# Patient Record
Sex: Male | Born: 1990 | Race: Black or African American | Hispanic: No | Marital: Single | Smoking: Current every day smoker
Health system: Southern US, Community
[De-identification: ages and names within clinical notes are randomized; demographics above are authoritative.]

---
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
## 2013-03-11 ENCOUNTER — Telehealth: Payer: Self-pay

## 2013-03-11 NOTE — Telephone Encounter (Signed)
Whoever ordered MRI needs to prescribe it.

## 2013-03-11 NOTE — Telephone Encounter (Signed)
Previous telephone message was taken on this pt in error.

## 2013-03-11 NOTE — Telephone Encounter (Signed)
Pt states he went to have his MRI brain done at [facility]. States he found out he's claustrophobic during that process and [facility] requires rx for a sedative to proceed with MRI.   Please call pt:  [phone]

## 2017-04-19 ENCOUNTER — Encounter (HOSPITAL_COMMUNITY): Payer: Self-pay

## 2017-04-19 ENCOUNTER — Other Ambulatory Visit: Payer: Self-pay

## 2017-04-19 DIAGNOSIS — R1084 Generalized abdominal pain: Secondary | ICD-10-CM | POA: Diagnosis not present

## 2017-04-19 DIAGNOSIS — R110 Nausea: Secondary | ICD-10-CM | POA: Diagnosis present

## 2017-04-19 DIAGNOSIS — R197 Diarrhea, unspecified: Secondary | ICD-10-CM | POA: Insufficient documentation

## 2017-04-19 DIAGNOSIS — F17200 Nicotine dependence, unspecified, uncomplicated: Secondary | ICD-10-CM | POA: Insufficient documentation

## 2017-04-19 LAB — COMPREHENSIVE METABOLIC PANEL
ALT: 18 U/L (ref 17–63)
ANION GAP: 11 (ref 5–15)
AST: 28 U/L (ref 15–41)
Albumin: 4.6 g/dL (ref 3.5–5.0)
Alkaline Phosphatase: 52 U/L (ref 38–126)
BILIRUBIN TOTAL: 0.8 mg/dL (ref 0.3–1.2)
BUN: 9 mg/dL (ref 6–20)
CO2: 25 mmol/L (ref 22–32)
Calcium: 9.7 mg/dL (ref 8.9–10.3)
Chloride: 106 mmol/L (ref 101–111)
Creatinine, Ser: 0.91 mg/dL (ref 0.61–1.24)
GFR calc non Af Amer: >60 mL/min (ref >60)
Glucose, Bld: 81 mg/dL (ref 65–99)
POTASSIUM: 3.9 mmol/L (ref 3.5–5.1)
Sodium: 142 mmol/L (ref 135–145)
TOTAL PROTEIN: 8.3 g/dL — AB (ref 6.5–8.1)

## 2017-04-19 LAB — URINALYSIS, ROUTINE W REFLEX MICROSCOPIC
BACTERIA UA: NONE SEEN
BILIRUBIN URINE: NEGATIVE
GLUCOSE, UA: NEGATIVE mg/dL
Hgb urine dipstick: NEGATIVE
KETONES UR: NEGATIVE mg/dL
LEUKOCYTES UA: NEGATIVE
NITRITE: NEGATIVE
PROTEIN: NEGATIVE mg/dL
RBC / HPF: NONE SEEN RBC/hpf (ref 0–5)
Specific Gravity, Urine: 1.016 (ref 1.005–1.030)
pH: 5 (ref 5.0–8.0)

## 2017-04-19 LAB — CBC
HEMATOCRIT: 45.2 % (ref 39.0–52.0)
HEMOGLOBIN: 14.8 g/dL (ref 13.0–17.0)
MCH: 28.8 pg (ref 26.0–34.0)
MCHC: 32.7 g/dL (ref 30.0–36.0)
MCV: 87.9 fL (ref 78.0–100.0)
Platelets: 285 10*3/uL (ref 150–400)
RBC: 5.14 MIL/uL (ref 4.22–5.81)
RDW: 14.6 % (ref 11.5–15.5)
WBC: 6 10*3/uL (ref 4.0–10.5)

## 2017-04-19 LAB — LIPASE, BLOOD: Lipase: 23 U/L (ref 11–51)

## 2017-04-19 MED ORDER — ONDANSETRON 4 MG PO TBDP
4.0000 mg | ORAL_TABLET | Freq: Once | ORAL | Status: AC | PRN
  Administered 2017-04-19: 4 mg via ORAL
  Filled 2017-04-19: qty 1

## 2017-04-19 NOTE — ED Triage Notes (Signed)
Pt reports 8/10 RLQ pain, nausea, and diarrhea x2 days. Pt denies emesis. Pt A+OX4, NAD.

## 2017-04-20 ENCOUNTER — Emergency Department (HOSPITAL_COMMUNITY)
Admission: EM | Admit: 2017-04-20 | Discharge: 2017-04-20 | Disposition: A | Discharge status: Home / Self Care | Payer: Medicaid Other | Attending: Emergency Medicine | Admitting: Emergency Medicine

## 2017-04-20 DIAGNOSIS — R197 Diarrhea, unspecified: Secondary | ICD-10-CM

## 2017-04-20 DIAGNOSIS — R110 Nausea: Secondary | ICD-10-CM

## 2017-04-20 DIAGNOSIS — R1084 Generalized abdominal pain: Secondary | ICD-10-CM

## 2017-04-20 MED ORDER — ONDANSETRON 4 MG PO TBDP: 4.0000 mg | ORAL_TABLET | Freq: Three times a day (TID) | ORAL | 0 refills | Status: DC | PRN

## 2017-04-20 NOTE — ED Provider Notes (Signed)
[facility]-EMERGENCY DEPT Provider Note   CSN: [id number] Arrival date & time: 04/19/17  1737     History   Chief Complaint Chief Complaint  Patient presents with  . Abdominal Pain    HPI Kent Gilbert is a 27 y.o. male.  Patient presents to the ED with a chief complaint of nausea and diarrhea.  He states that the symptoms started 2 days ago.  He states that his family member was sick with the same recently.  He reports associated crampy abdominal pain.  He has not taken anything for his symptoms.  He denies any fevers or chills.   The history is provided by the patient. No language interpreter was used.    History reviewed. No pertinent past medical history.  There are no active problems to display for this patient.   History reviewed. No pertinent surgical history.      Home Medications    Prior to Admission medications   Not on File    Family History History reviewed. No pertinent family history.  Social History Social History   Tobacco Use  . Smoking status: Current Every Day Smoker  . Smokeless tobacco: Never Used  Substance Use Topics  . Alcohol use: Yes  . Drug use: Not on file     Allergies   Patient has no known allergies.   Review of Systems Review of Systems  All other systems reviewed and are negative.    Physical Exam Updated Vital Signs BP (!) 143/90   Pulse 72   Temp 98.4 F (36.9 C) (Oral)   Resp 17   SpO2 100%   Physical Exam  Constitutional: He is oriented to person, place, and time. He appears well-developed and well-nourished.  HENT:  Head: Normocephalic and atraumatic.  Eyes: Pupils are equal, round, and reactive to light. Conjunctivae and EOM are normal. Right eye exhibits no discharge. Left eye exhibits no discharge. No scleral icterus.  Neck: Normal range of motion. Neck supple. No JVD present.  Cardiovascular: Normal rate, regular rhythm and normal heart sounds. Exam reveals no gallop and no  friction rub.  No murmur heard. Pulmonary/Chest: Effort normal and breath sounds normal. No respiratory distress. He has no wheezes. He has no rales. He exhibits no tenderness.  Abdominal: Soft. He exhibits no distension and no mass. There is generalized tenderness. There is no rebound and no guarding.  Musculoskeletal: Normal range of motion. He exhibits no edema or tenderness.  Neurological: He is alert and oriented to person, place, and time.  Skin: Skin is warm and dry.  Psychiatric: He has a normal mood and affect. His behavior is normal. Judgment and thought content normal.  Nursing note and vitals reviewed.    ED Treatments / Results  Labs (all labs ordered are listed, but only abnormal results are displayed) Labs Reviewed  COMPREHENSIVE METABOLIC PANEL - Abnormal; Notable for the following components:      Result Value   Total Protein 8.3 (*)    All other components within normal limits  URINALYSIS, ROUTINE W REFLEX MICROSCOPIC - Abnormal; Notable for the following components:   Squamous Epithelial / LPF 0-5 (*)    All other components within normal limits  LIPASE, BLOOD  CBC    EKG None  Radiology No results found.  Procedures Procedures (including critical care time)  Medications Ordered in ED Medications  ondansetron (ZOFRAN-ODT) disintegrating tablet 4 mg (4 mg Oral Given 04/19/17 1836)     Initial Impression / Assessment  and Plan / ED Course  I have reviewed the triage vital signs and the nursing notes.  Pertinent labs & imaging results that were available during my care of the patient were reviewed by me and considered in my medical decision making (see chart for details).     Patient with nausea and diarrhea.  Also has generalized abdominal discomfort, but no focal tenderness on exam.  Afebrile.  VSS.  Labs are unremarkable, no leukocytosis and normal electrolytes and kidney function.  Family member was sick with similar symptoms.  Likely viral etiology.   Doubt surgical or acute abdomen, but close return precautions have been given.  Patient understands and agrees with the plan.  Final Clinical Impressions(s) / ED Diagnoses   Final diagnoses:  Diarrhea, unspecified type  Nausea  Generalized abdominal pain    ED Discharge Orders        Ordered    ondansetron (ZOFRAN ODT) 4 MG disintegrating tablet  Every 8 hours PRN     04/20/17 0137       Roxy Horseman, PA-C 04/20/17 2130    Dione Booze, MD 04/20/17 [phone]

## 2017-05-28 ENCOUNTER — Encounter (HOSPITAL_COMMUNITY): Payer: Self-pay | Admitting: Emergency Medicine

## 2017-05-28 ENCOUNTER — Ambulatory Visit (HOSPITAL_COMMUNITY)
Admission: EM | Admit: 2017-05-28 | Discharge: 2017-05-28 | Disposition: A | Discharge status: Home / Self Care | Payer: Medicaid Other | Attending: Internal Medicine | Admitting: Internal Medicine

## 2017-05-28 DIAGNOSIS — A084 Viral intestinal infection, unspecified: Secondary | ICD-10-CM | POA: Diagnosis not present

## 2017-05-28 MED ORDER — ONDANSETRON 4 MG PO TBDP
8.0000 mg | ORAL_TABLET | Freq: Once | ORAL | Status: AC
  Administered 2017-05-28: 8 mg via ORAL

## 2017-05-28 MED ORDER — ONDANSETRON 8 MG PO TBDP: 8.0000 mg | ORAL_TABLET | Freq: Three times a day (TID) | ORAL | 0 refills | Status: AC | PRN

## 2017-05-28 MED ORDER — ONDANSETRON 4 MG PO TBDP
ORAL_TABLET | ORAL | Status: AC
  Filled 2017-05-28: qty 2

## 2017-05-28 NOTE — ED Provider Notes (Signed)
[facility]    CSN: [id number] Arrival date & time: 05/28/17  1706     History   Chief Complaint Chief Complaint  Patient presents with  . Nausea    HPI Kent Gilbert is a 27 y.o. male.   This is a healthy 27 year old male, comes in today for abdominal pain with nausea, vomiting and diarrhea onset yesterday.  Patient reports 4 episodes of diarrhea yesterday and 2 episodes today without any blood in stool.  Patient reports 4 episodes of emesis yesterday and 2 episodes today. He endorses abd pain all over abdomen. He felt like he had a fever. Temp is 99.1 in office today. Overall symptoms is improving. Denies sick contact exposure.      History reviewed. No pertinent past medical history.  There are no active problems to display for this patient.   History reviewed. No pertinent surgical history.     Home Medications    Prior to Admission medications   Medication Sig Start Date End Date Taking? Authorizing Provider  ondansetron (ZOFRAN-ODT) 8 MG disintegrating tablet Take 1 tablet (8 mg total) by mouth every 8 (eight) hours as needed for nausea or vomiting. 05/28/17   Lucia Estelle, NP    Family History History reviewed. No pertinent family history.  Social History Social History   Tobacco Use  . Smoking status: Current Every Day Smoker  . Smokeless tobacco: Never Used  Substance Use Topics  . Alcohol use: Yes  . Drug use: Not on file     Allergies   Patient has no known allergies.   Review of Systems Review of Systems  Constitutional: Positive for fever. Negative for chills and fatigue.  Respiratory: Negative.  Negative for cough and shortness of breath.   Cardiovascular: Negative for chest pain and palpitations.  Gastrointestinal:       As stated in the HPI  Skin: Negative for rash.  Neurological: Negative for dizziness and headaches.     Physical Exam Triage Vital Signs ED Triage Vitals [05/28/17 1759]  Enc Vitals Group     BP  131/85     Pulse Rate 81     Resp 18     Temp 99.1 F (37.3 C)     Temp Source Oral     SpO2 98 %     Weight      Height      Head Circumference      Peak Flow      Pain Score      Pain Loc      Pain Edu?      Excl. in GC?    No data found.  Updated Vital Signs BP 131/85 (BP Location: Left Arm)   Pulse 81   Temp 99.1 F (37.3 C) (Oral)   Resp 18   SpO2 98%   Visual Acuity Right Eye Distance:   Left Eye Distance:   Bilateral Distance:    Right Eye Near:   Left Eye Near:    Bilateral Near:     Physical Exam  Constitutional: He is oriented to person, place, and time. He appears well-developed and well-nourished.  HENT:  Head: Normocephalic and atraumatic.  Neck: Normal range of motion. Neck supple.  Cardiovascular: Normal rate, regular rhythm and normal heart sounds.  Pulmonary/Chest: Effort normal and breath sounds normal. He has no wheezes.  Abdominal: Soft. Bowel sounds are normal. There is tenderness (Generalized tenderness present.).  Neurological: He is alert and oriented to person, place, and time.  Nursing note and vitals reviewed.    UC Treatments / Results  Labs (all labs ordered are listed, but only abnormal results are displayed) Labs Reviewed - No data to display  EKG None  Radiology No results found.  Procedures Procedures (including critical care time)  Medications Ordered in UC Medications  ondansetron (ZOFRAN-ODT) disintegrating tablet 8 mg (has no administration in time range)    Initial Impression / Assessment and Plan / UC Course  I have reviewed the triage vital signs and the nursing notes.  Pertinent labs & imaging results that were available during my care of the patient were reviewed by me and considered in my medical decision making (see chart for details).   Final Clinical Impressions(s) / UC Diagnoses   Final diagnoses:  Viral gastroenteritis   Physical examination unremarkable.  Clinical presentation is most  consistent with viral gastroenteritis.  Prescription for Zofran given to help with nausea.  Recommending brat diet.  Recommending sugar-free Gatorade for hydration.  Patient may use over-the-counter Imodium for diarrhea as needed.  Informed to follow-up with PCP or return if symptoms does not improve. Discharge Instructions   None    ED Prescriptions    Medication Sig Dispense Auth. Provider   ondansetron (ZOFRAN-ODT) 8 MG disintegrating tablet Take 1 tablet (8 mg total) by mouth every 8 (eight) hours as needed for nausea or vomiting. 10 tablet Lucia Estelle, NP     Controlled Substance Prescriptions [place] Controlled Substance Registry consulted? Not Applicable   Lucia Estelle, NP 05/28/17 Paulo Fruit

## 2017-05-28 NOTE — ED Triage Notes (Signed)
Pt sts abd pain with nausea and vomiting x 1

## 2018-04-20 ENCOUNTER — Emergency Department (HOSPITAL_COMMUNITY): Payer: No Typology Code available for payment source

## 2018-04-20 ENCOUNTER — Encounter (HOSPITAL_COMMUNITY): Payer: Self-pay

## 2018-04-20 ENCOUNTER — Emergency Department (HOSPITAL_COMMUNITY)
Admission: EM | Admit: 2018-04-20 | Discharge: 2018-04-20 | Disposition: A | Discharge status: Home / Self Care | Payer: No Typology Code available for payment source | Attending: Emergency Medicine | Admitting: Emergency Medicine

## 2018-04-20 ENCOUNTER — Other Ambulatory Visit: Payer: Self-pay

## 2018-04-20 DIAGNOSIS — Y92410 Unspecified street and highway as the place of occurrence of the external cause: Secondary | ICD-10-CM | POA: Insufficient documentation

## 2018-04-20 DIAGNOSIS — S060X0A Concussion without loss of consciousness, initial encounter: Secondary | ICD-10-CM | POA: Diagnosis not present

## 2018-04-20 DIAGNOSIS — R51 Headache: Secondary | ICD-10-CM | POA: Diagnosis present

## 2018-04-20 DIAGNOSIS — S20219A Contusion of unspecified front wall of thorax, initial encounter: Secondary | ICD-10-CM | POA: Diagnosis not present

## 2018-04-20 DIAGNOSIS — F17210 Nicotine dependence, cigarettes, uncomplicated: Secondary | ICD-10-CM | POA: Diagnosis not present

## 2018-04-20 DIAGNOSIS — S8012XA Contusion of left lower leg, initial encounter: Secondary | ICD-10-CM

## 2018-04-20 DIAGNOSIS — Y939 Activity, unspecified: Secondary | ICD-10-CM | POA: Diagnosis not present

## 2018-04-20 DIAGNOSIS — Y999 Unspecified external cause status: Secondary | ICD-10-CM | POA: Diagnosis not present

## 2018-04-20 MED ORDER — HYDROCODONE-ACETAMINOPHEN 5-325 MG PO TABS
1.0000 | ORAL_TABLET | Freq: Once | ORAL | Status: AC
  Administered 2018-04-20: 1 via ORAL
  Filled 2018-04-20: qty 1

## 2018-04-20 MED ORDER — IBUPROFEN 600 MG PO TABS: 600.0000 mg | ORAL_TABLET | Freq: Four times a day (QID) | ORAL | 0 refills | Status: DC | PRN

## 2018-04-20 MED ORDER — HYDROCODONE-ACETAMINOPHEN 5-325 MG PO TABS: 1.0000 | ORAL_TABLET | ORAL | 0 refills | Status: DC | PRN

## 2018-04-20 MED ORDER — IBUPROFEN 800 MG PO TABS
800.0000 mg | ORAL_TABLET | Freq: Once | ORAL | Status: AC
  Administered 2018-04-20: 800 mg via ORAL
  Filled 2018-04-20: qty 1
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
## 2018-04-20 NOTE — ED Notes (Signed)
RETURNED FROM X-RAY

## 2018-04-20 NOTE — ED Notes (Signed)
PT. CURRENTLY IN X-RAY.  

## 2018-04-20 NOTE — ED Notes (Signed)
ORTHO TECH CALLED TO ASSIST
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
## 2020-04-09 ENCOUNTER — Encounter (HOSPITAL_COMMUNITY): Payer: Self-pay | Admitting: Urgent Care

## 2020-04-09 ENCOUNTER — Other Ambulatory Visit: Payer: Self-pay

## 2020-04-09 ENCOUNTER — Ambulatory Visit (HOSPITAL_COMMUNITY)
Admission: EM | Admit: 2020-04-09 | Discharge: 2020-04-09 | Disposition: A | Discharge status: Home / Self Care | Payer: Medicaid Other | Attending: Urgent Care | Admitting: Urgent Care

## 2020-04-09 DIAGNOSIS — Z20822 Contact with and (suspected) exposure to covid-19: Secondary | ICD-10-CM | POA: Insufficient documentation

## 2020-04-09 DIAGNOSIS — Z7251 High risk heterosexual behavior: Secondary | ICD-10-CM | POA: Insufficient documentation

## 2020-04-09 DIAGNOSIS — F17210 Nicotine dependence, cigarettes, uncomplicated: Secondary | ICD-10-CM | POA: Insufficient documentation

## 2020-04-09 DIAGNOSIS — F17200 Nicotine dependence, unspecified, uncomplicated: Secondary | ICD-10-CM

## 2020-04-09 DIAGNOSIS — R197 Diarrhea, unspecified: Secondary | ICD-10-CM | POA: Insufficient documentation

## 2020-04-09 DIAGNOSIS — R0602 Shortness of breath: Secondary | ICD-10-CM | POA: Insufficient documentation

## 2020-04-09 DIAGNOSIS — R000 Tachycardia, unspecified: Secondary | ICD-10-CM | POA: Insufficient documentation

## 2020-04-09 DIAGNOSIS — B349 Viral infection, unspecified: Secondary | ICD-10-CM | POA: Diagnosis not present

## 2020-04-09 MED ORDER — LOPERAMIDE HCL 2 MG PO CAPS: 2.0000 mg | ORAL_CAPSULE | Freq: Two times a day (BID) | ORAL | 0 refills | Status: AC | PRN

## 2020-04-09 MED ORDER — ONDANSETRON 8 MG PO TBDP
8.0000 mg | ORAL_TABLET | Freq: Three times a day (TID) | ORAL | 0 refills | Status: AC | PRN
Start: 2020-04-09 — End: ?

## 2020-04-09 NOTE — Discharge Instructions (Signed)
We will notify you of your COVID-19 test results as they arrive and may take between 24 to 48 hours.  I encourage you to sign up for MyChart if you have not already done so as this can be the easiest way for [place] to communicate results to you online or through a phone app.  In the meantime, if you develop worsening symptoms including fever, chest pain, shortness of breath despite our current treatment plan then please report to the emergency room as this may be a sign of worsening status from possible COVID-19 infection.  Otherwise, we will manage this as a viral syndrome. For sore throat or cough try using a honey-based tea. Use 3 teaspoons of honey with juice squeezed from half lemon. Place shaved pieces of ginger into 1/2-1 cup of water and warm over stove top. Then mix the ingredients and repeat every 4 hours as needed. Please take Tylenol 500mg -650mg  every 6 hours for aches and pains, fevers. Hydrate very well with at least 2 liters of water. Eat light meals such as soups to replenish electrolytes and soft fruits, veggies.    For diabetes or elevated blood sugar, please make sure you are limiting and avoiding starchy, carbohydrate foods like pasta, breads, sweet breads, pastry, rice, potatoes, desserts. These foods can elevate your blood sugar. Also, limit and avoid drinks that contain a lot of sugar such as sodas, sweet teas, fruit juices.  Drinking plain water will be much more helpful, try 64 ounces of water daily.  It is okay to flavor your water naturally by cutting cucumber, lemon, mint or lime, placing it in a picture with water and drinking it over a period of 24-48 hours as long as it remains refrigerated.  For elevated blood pressure, make sure you are monitoring salt in your diet.  Do not eat restaurant foods and limit processed foods at home. I highly recommend you prepare and cook your own foods at home.  Processed foods include things like frozen meals, pre-seasoned meats and dinners, deli  meats, canned foods as these foods contain a high amount of sodium/salt.  Make sure you are paying attention to sodium labels on foods you buy at the grocery store. Buy your spices separately such as garlic powder, onion powder, cumin, cayenne, parsley flakes so that you can avoid seasonings that contain salt. However, salt-free seasonings are available and can be used, an example is Mrs. Dash and includes a lot of different mixtures that do not contain salt.  Lastly, when cooking using oils that are healthier for you is important. This includes olive oil, avocado oil, canola oil. We have discussed a lot of foods to avoid but below is a list of foods that can be very healthy to use in your diet whether it is for diabetes, cholesterol, high blood pressure, or in general healthy eating.  Salads - kale, spinach, cabbage, spring mix, arugula Fruits - avocadoes, berries (blueberries, raspberries, blackberries), apples, oranges, pomegranate, grapefruit, kiwi Vegetables - asparagus, cauliflower, broccoli, green beans, brussel sprouts, bell peppers, beets; stay away from or limit starchy vegetables like potatoes, carrots, peas Other general foods - kidney beans, egg whites, almonds, walnuts, sunflower seeds, pumpkin seeds, fat free yogurt, almond milk, flax seeds, quinoa, oats  Meat - It is better to eat lean meats and limit your red meat including pork to once a week.  Wild caught fish, chicken breast are good options as they tend to be leaner sources of good protein. Still be mindful of the  sodium labels for the meats you buy.  DO NOT EAT ANY FOODS ON THIS LIST THAT YOU ARE ALLERGIC TO. For more specific needs, I highly recommend consulting a dietician or nutritionist but this can definitely be a good starting point.

## 2020-04-09 NOTE — ED Provider Notes (Signed)
[facility]   MRN: [id number] DOB: March 01, 1990  Subjective:   Kent Gilbert is a 30 y.o. male presenting for an evaluation.  Patient states that he was at work and felt feverish, checked his temperature multiple times and is consistently above 101 F.  He has also had 2-3 bouts of diarrhea per day for the past 7 days.  Patient is a smoker and has intermittent shortness of breath, smokes half pack per day.  Denies headache, sinus congestion, sore throat, cough, chest pain, nausea, vomiting, abdominal pain, body aches.  Patient is not COVID vaccinated.  Patient is sexually active, does not use condoms for protection, has 1 sex partner.  Would like to make sure he does not have an STI except for HIV and syphilis.  Denies penile discharge, dysuria, urinary frequency, testicular or penile pain, genital rash.  No current facility-administered medications for this encounter.  Current Outpatient Medications:  .  ibuprofen (ADVIL,MOTRIN) 600 MG tablet, Take 1 tablet (600 mg total) by mouth every 6 (six) hours as needed., Disp: 30 tablet, Rfl: 0 .  ondansetron (ZOFRAN-ODT) 8 MG disintegrating tablet, Take 1 tablet (8 mg total) by mouth every 8 (eight) hours as needed for nausea or vomiting., Disp: 10 tablet, Rfl: 0   No Known Allergies  History reviewed. No pertinent past medical history.   History reviewed. No pertinent surgical history.  History reviewed. No pertinent family history.  Social History   Tobacco Use  . Smoking status: Current Every Day Smoker    Packs/day: 0.25    Types: Cigarettes  . Smokeless tobacco: Never Used  Vaping Use  . Vaping Use: Never used  Substance Use Topics  . Alcohol use: Yes  . Drug use: Never    ROS   Objective:   Vitals: BP (!) 134/95 (BP Location: Right Arm)   Pulse (!) 114   Temp 99.4 F (37.4 C) (Oral)   Resp 18   SpO2 97%   Pulse rechecks ranged from 98 to 116 bpm.  Physical Exam Constitutional:      General: He is  not in acute distress.    Appearance: Normal appearance. He is well-developed. He is not ill-appearing, toxic-appearing or diaphoretic.  HENT:     Head: Normocephalic and atraumatic.     Right Ear: External ear normal.     Left Ear: External ear normal.     Nose: Nose normal.     Mouth/Throat:     Mouth: Mucous membranes are moist.     Pharynx: Oropharynx is clear.  Eyes:     General: No scleral icterus.    Extraocular Movements: Extraocular movements intact.     Pupils: Pupils are equal, round, and reactive to light.  Cardiovascular:     Rate and Rhythm: Normal rate and regular rhythm.     Heart sounds: Normal heart sounds. No murmur heard. No friction rub. No gallop.   Pulmonary:     Effort: Pulmonary effort is normal. No respiratory distress.     Breath sounds: Normal breath sounds. No stridor. No wheezing, rhonchi or rales.  Abdominal:     General: There is no distension.     Palpations: Abdomen is soft. There is no mass.     Tenderness: There is no abdominal tenderness. There is no guarding or rebound.     Comments: Hyperactive bowel sounds.  Skin:    General: Skin is warm and dry.  Neurological:     Mental Status: He is  alert and oriented to person, place, and time.  Psychiatric:        Mood and Affect: Mood normal.        Behavior: Behavior normal.        Thought Content: Thought content normal.     ED ECG REPORT   Date: 04/09/2020  Rate: 102bpm  Rhythm: sinus tachycardia  QRS Axis: normal  Intervals: normal  ST/T Wave abnormalities: normal  Conduction Disutrbances:none  Narrative Interpretation: Sinus tachycardia at 102 bpm without any acute findings.  No previous EKG available for comparison.  Old EKG Reviewed: none available  I have personally reviewed the EKG tracing and agree with the computerized printout as noted.   Assessment and Plan :   PDMP not reviewed this encounter.  1. Viral illness   2. Diarrhea, unspecified type   3. Tachycardia   4.  Shortness of breath   5. Smoker   6. Unprotected sex     COVID-19 testing is pending.  Have a high suspicion for this given his symptoms today.  Recommended supportive care including Tylenol, rest, fluids.  Regarding his diarrhea, counseled on need for significant dietary modifications as he eats fast food almost exclusively.  Provided him with information on healthy diet.  STI testing is pending. Counseled patient on potential for adverse effects with medications prescribed/recommended today, ER and return-to-clinic precautions discussed, patient verbalized understanding.    Wallis Bamberg, [place] 04/09/20 1850

## 2020-04-09 NOTE — ED Triage Notes (Signed)
Pt presents for note to return to work after having a temperature at temp. Check;  Pt also presents for STD testing with no complaints of any symptoms.

## 2020-04-10 LAB — CYTOLOGY, (ORAL, ANAL, URETHRAL) ANCILLARY ONLY
Chlamydia: NEGATIVE
Comment: NEGATIVE
Comment: NEGATIVE
Comment: NORMAL
Neisseria Gonorrhea: NEGATIVE
Trichomonas: NEGATIVE

## 2020-04-10 LAB — SARS CORONAVIRUS 2 (TAT 6-24 HRS): SARS Coronavirus 2: NEGATIVE

## 2020-05-05 ENCOUNTER — Ambulatory Visit (HOSPITAL_COMMUNITY)
Admission: EM | Admit: 2020-05-05 | Discharge: 2020-05-05 | Disposition: A | Discharge status: Home / Self Care | Payer: Medicaid Other | Attending: Physician Assistant | Admitting: Physician Assistant

## 2020-05-05 ENCOUNTER — Encounter (HOSPITAL_COMMUNITY): Payer: Self-pay

## 2020-05-05 ENCOUNTER — Other Ambulatory Visit: Payer: Self-pay

## 2020-05-05 DIAGNOSIS — M542 Cervicalgia: Secondary | ICD-10-CM | POA: Insufficient documentation

## 2020-05-05 DIAGNOSIS — J029 Acute pharyngitis, unspecified: Secondary | ICD-10-CM | POA: Diagnosis not present

## 2020-05-05 DIAGNOSIS — M62838 Other muscle spasm: Secondary | ICD-10-CM | POA: Diagnosis not present

## 2020-05-05 LAB — POCT RAPID STREP A, ED / UC: Streptococcus, Group A Screen (Direct): NEGATIVE

## 2020-05-05 MED ORDER — METHOCARBAMOL 500 MG PO TABS: 500.0000 mg | ORAL_TABLET | Freq: Two times a day (BID) | ORAL | 0 refills | Status: AC

## 2020-05-05 NOTE — ED Provider Notes (Signed)
[facility]    CSN: [id number] Arrival date & time: 05/05/20  1831      History   Chief Complaint Chief Complaint  Patient presents with  . Shoulder Pain  . Back Pain  . Vomiting    HPI Kent Gilbert is a 30 y.o. male.   Patient presents today with several complaints.  He reports a 1 week history of intermittent neck and shoulder pain.  He denies known injury or increase in activity prior to symptom onset.  He does have a physical job requiring him to lift throughout the day.  He has not tried any over-the-counter medications for symptom management.  Reports pain is rated 10 on a 0-10 pain scale, described as aching, worse with certain movements or palpation, no alleviating factors and affect.  Denies previous neck surgery or injury.  He denies history of malignancy.  He denies associated fever, weakness, nausea, vomiting, paresthesias.  In addition, patient reports a 1 day history of sore throat.  He reports associated nausea and vomiting that occurred last night but states the symptoms have resolved.  He denies any cough, nasal congestion, fever, headaches, dizziness, abdominal pain, changes in bowel habits.  Reports son was sick with similar symptoms prior to symptom onset.  He denies any recent antibiotic use.  He denies history of allergies, asthma, COPD.  He has not had flu or COVID-19 vaccinations.  He denies any dysphagia, muffled voice, odynophagia.     History reviewed. No pertinent past medical history.  There are no problems to display for this patient.   History reviewed. No pertinent surgical history.     Home Medications    Prior to Admission medications   Medication Sig Start Date End Date Taking? Authorizing Provider  methocarbamol (ROBAXIN) 500 MG tablet Take 1 tablet (500 mg total) by mouth 2 (two) times daily. 05/05/20  Yes Siani Utke, Denny Peon K, PA-C  loperamide (IMODIUM) 2 MG capsule Take 1 capsule (2 mg total) by mouth 2 (two) times daily as needed  for diarrhea or loose stools. 04/09/20   Wallis Bamberg, PA-C  ondansetron (ZOFRAN-ODT) 8 MG disintegrating tablet Take 1 tablet (8 mg total) by mouth every 8 (eight) hours as needed for nausea or vomiting. 05/28/17   Lucia Estelle, NP  ondansetron (ZOFRAN-ODT) 8 MG disintegrating tablet Take 1 tablet (8 mg total) by mouth every 8 (eight) hours as needed for nausea or vomiting. 04/09/20   Wallis Bamberg, PA-C    Family History History reviewed. No pertinent family history.  Social History Social History   Tobacco Use  . Smoking status: Current Every Day Smoker    Packs/day: 0.25    Types: Cigarettes  . Smokeless tobacco: Never Used  Vaping Use  . Vaping Use: Never used  Substance Use Topics  . Alcohol use: Yes  . Drug use: Never     Allergies   Patient has no known allergies.   Review of Systems Review of Systems  Constitutional: Negative for activity change, appetite change, fatigue and fever.  HENT: Positive for sore throat. Negative for congestion, sinus pressure and sneezing.   Respiratory: Negative for cough and shortness of breath.   Gastrointestinal: Positive for nausea and vomiting. Negative for abdominal pain and diarrhea.  Musculoskeletal: Positive for back pain. Negative for arthralgias and myalgias.  Neurological: Negative for dizziness, light-headedness and headaches.     Physical Exam Triage Vital Signs ED Triage Vitals  Enc Vitals Group     BP 05/05/20 1949 (!) 141/101  Pulse Rate 05/05/20 1949 86     Resp 05/05/20 1949 18     Temp 05/05/20 1949 98 F (36.7 C)     Temp Source 05/05/20 1949 Oral     SpO2 05/05/20 1949 99 %     Weight --      Height --      Head Circumference --      Peak Flow --      Pain Score 05/05/20 1950 10     Pain Loc --      Pain Edu? --      Excl. in GC? --    No data found.  Updated Vital Signs BP (!) 141/101 (BP Location: Right Arm)   Pulse 86   Temp 98 F (36.7 C) (Oral)   Resp 18   SpO2 99%   Visual Acuity Right  Eye Distance:   Left Eye Distance:   Bilateral Distance:    Right Eye Near:   Left Eye Near:    Bilateral Near:     Physical Exam Vitals reviewed.  Constitutional:      General: He is awake.     Appearance: Normal appearance. He is normal weight. He is not ill-appearing.     Comments: Very pleasant male appears stated age in no acute distress  HENT:     Head: Normocephalic and atraumatic.     Right Ear: Tympanic membrane, ear canal and external ear normal. Tympanic membrane is not erythematous or bulging.     Left Ear: Tympanic membrane, ear canal and external ear normal. Tympanic membrane is not erythematous or bulging.     Nose: Nose normal.     Mouth/Throat:     Pharynx: Uvula midline. Posterior oropharyngeal erythema present. No oropharyngeal exudate or uvula swelling.     Comments: Moderate erythema posterior oropharynx Cardiovascular:     Rate and Rhythm: Normal rate and regular rhythm.     Heart sounds: No murmur heard.   Pulmonary:     Effort: Pulmonary effort is normal. No accessory muscle usage or respiratory distress.     Breath sounds: Normal breath sounds. No stridor. No wheezing, rhonchi or rales.     Comments: Clear to auscultation bilaterally Musculoskeletal:     Cervical back: Spasms and tenderness present. No bony tenderness. No pain with movement.     Thoracic back: No tenderness or bony tenderness.     Lumbar back: No tenderness or bony tenderness.     Comments: Back: No pain with percussion of vertebrae.  Normal active range of motion.  Spasm noted along right trapezius.  Tenderness palpation of bilateral paraspinal muscles.  Lymphadenopathy:     Head:     Right side of head: No submental, submandibular or tonsillar adenopathy.     Left side of head: No submental, submandibular or tonsillar adenopathy.     Cervical: No cervical adenopathy.  Neurological:     Mental Status: He is alert.  Psychiatric:        Behavior: Behavior is cooperative.       UC Treatments / Results  Labs (all labs ordered are listed, but only abnormal results are displayed) Labs Reviewed  CULTURE, GROUP A STREP [facility])  POCT RAPID STREP A, ED / UC    EKG   Radiology No results found.  Procedures Procedures (including critical care time)  Medications Ordered in UC Medications - No data to display  Initial Impression / Assessment and Plan / UC Course  I have reviewed the  triage vital signs and the nursing notes.  Pertinent labs & imaging results that were available during my care of the patient were reviewed by me and considered in my medical decision making (see chart for details).     Centor score of 1.  Rapid strep negative in office today.  Throat culture obtained-results pending.  Suspect viral etiology.  Patient was encouraged to continue with supportive care.  Strict return precautions given to which patient expressed understanding.  Suspect muscle strain given etiology.  Patient was prescribed methocarbamol to be used at night with instruction not to drive or drink alcohol with this medication as drowsiness is a common side effect.  He can alternate Tylenol and ibuprofen for additional pain relief.  Encouraged heat, rest, stretch.  If symptoms persist or worsen we will need to consider physical therapy and/or imaging which will need to be arranged through PCP.  Strict return precautions given to which patient expressed understanding.  Final Clinical Impressions(s) / UC Diagnoses   Final diagnoses:  Viral pharyngitis  Sore throat  Cervical pain  Muscle spasms of neck     Discharge Instructions     Your strep was negative in office today.  If your culture is positive we will contact you and start antibiotics.  Drink plenty of fluid and use Tylenol/ibuprofen for pain.  He can also gargle with warm salt water to help manage symptoms.  I have called in methocarbamol to help with your back/neck pain.  Do not drive or drink alcohol this  medication is drowsiness is a common side effect.  If you have any worsening symptoms you need to be reevaluated.    ED Prescriptions    Medication Sig Dispense Auth. Provider   methocarbamol (ROBAXIN) 500 MG tablet Take 1 tablet (500 mg total) by mouth 2 (two) times daily. 20 tablet Diontae Route, Noberto Retort, PA-C     PDMP not reviewed this encounter.   Jeani Hawking, PA-C 05/05/20 2052

## 2020-05-05 NOTE — ED Triage Notes (Signed)
Pt present back and shoulder pain with a sore throat. Pt states that shoulder and back pain started two weeks ago. His throat started last night.

## 2020-05-05 NOTE — Discharge Instructions (Addendum)
Your strep was negative in office today.  If your culture is positive we will contact you and start antibiotics.  Drink plenty of fluid and use Tylenol/ibuprofen for pain.  He can also gargle with warm salt water to help manage symptoms.  I have called in methocarbamol to help with your back/neck pain.  Do not drive or drink alcohol this medication is drowsiness is a common side effect.  If you have any worsening symptoms you need to be reevaluated.

## 2020-05-06 LAB — CULTURE, GROUP A STREP (THRC)

## 2020-05-07 ENCOUNTER — Telehealth (HOSPITAL_COMMUNITY): Payer: Self-pay | Admitting: Emergency Medicine

## 2020-05-07 LAB — CULTURE, GROUP A STREP (THRC)

## 2020-05-07 MED ORDER — PENICILLIN V POTASSIUM 500 MG PO TABS: 500.0000 mg | ORAL_TABLET | Freq: Two times a day (BID) | ORAL | 0 refills | Status: AC

## 2022-07-17 ENCOUNTER — Encounter (HOSPITAL_COMMUNITY): Payer: Self-pay

## 2022-07-17 ENCOUNTER — Emergency Department (HOSPITAL_COMMUNITY): Payer: No Typology Code available for payment source

## 2022-07-17 ENCOUNTER — Other Ambulatory Visit: Payer: Self-pay

## 2022-07-17 ENCOUNTER — Emergency Department (HOSPITAL_COMMUNITY)
Admission: EM | Admit: 2022-07-17 | Discharge: 2022-07-17 | Disposition: A | Discharge status: Home / Self Care | Payer: No Typology Code available for payment source | Attending: Emergency Medicine | Admitting: Emergency Medicine

## 2022-07-17 DIAGNOSIS — Y92410 Unspecified street and highway as the place of occurrence of the external cause: Secondary | ICD-10-CM | POA: Diagnosis not present

## 2022-07-17 DIAGNOSIS — S8012XA Contusion of left lower leg, initial encounter: Secondary | ICD-10-CM | POA: Insufficient documentation

## 2022-07-17 DIAGNOSIS — M79662 Pain in left lower leg: Secondary | ICD-10-CM | POA: Diagnosis present

## 2022-07-17 LAB — CBC WITH DIFFERENTIAL/PLATELET
Abs Immature Granulocytes: 0.01 10*3/uL (ref 0.00–0.07)
Basophils Absolute: 0.1 10*3/uL (ref 0.0–0.1)
Basophils Relative: 1 %
Eosinophils Absolute: 0.1 10*3/uL (ref 0.0–0.5)
Eosinophils Relative: 2 %
HCT: 42.2 % (ref 39.0–52.0)
Hemoglobin: 13.5 g/dL (ref 13.0–17.0)
Immature Granulocytes: 0 %
Lymphocytes Relative: 44 %
Lymphs Abs: 2.8 10*3/uL (ref 0.7–4.0)
MCH: 27.9 pg (ref 26.0–34.0)
MCHC: 32 g/dL (ref 30.0–36.0)
MCV: 87.2 fL (ref 80.0–100.0)
Monocytes Absolute: 0.5 10*3/uL (ref 0.1–1.0)
Monocytes Relative: 9 %
Neutro Abs: 2.8 10*3/uL (ref 1.7–7.7)
Neutrophils Relative %: 44 %
Platelets: 363 10*3/uL (ref 150–400)
RBC: 4.84 MIL/uL (ref 4.22–5.81)
RDW: 14.4 % (ref 11.5–15.5)
WBC: 6.3 10*3/uL (ref 4.0–10.5)
nRBC: 0 % (ref 0.0–0.2)

## 2022-07-17 LAB — BASIC METABOLIC PANEL
Anion gap: 13 (ref 5–15)
BUN: 7 mg/dL (ref 6–20)
CO2: 23 mmol/L (ref 22–32)
Calcium: 9.6 mg/dL (ref 8.9–10.3)
Chloride: 104 mmol/L (ref 98–111)
Creatinine, Ser: 1.04 mg/dL (ref 0.61–1.24)
GFR, Estimated: >60 mL/min (ref >60)
Glucose, Bld: 93 mg/dL (ref 70–99)
Potassium: 4.3 mmol/L (ref 3.5–5.1)
Sodium: 140 mmol/L (ref 135–145)

## 2022-07-17 LAB — TROPONIN I (HIGH SENSITIVITY): Troponin I (High Sensitivity): 4 ng/L (ref <18)

## 2022-07-17 MED ORDER — KETOROLAC TROMETHAMINE 15 MG/ML IJ SOLN
15.0000 mg | Freq: Once | INTRAMUSCULAR | Status: DC
  Filled 2022-07-17: qty 1

## 2022-07-17 MED ORDER — IOHEXOL 350 MG/ML SOLN
50.0000 mL | Freq: Once | INTRAVENOUS | Status: AC | PRN
  Administered 2022-07-17: 50 mL via INTRAVENOUS

## 2022-07-17 MED ORDER — FENTANYL CITRATE PF 50 MCG/ML IJ SOSY
50.0000 ug | PREFILLED_SYRINGE | Freq: Once | INTRAMUSCULAR | Status: AC
  Administered 2022-07-17: 50 ug via INTRAVENOUS
  Filled 2022-07-17: qty 1
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
## 2022-07-17 NOTE — Discharge Instructions (Addendum)
Your imaging testing was negative for acute traumatic injuries.  Recommend Tylenol and ibuprofen for pain control.  Your muscle aches will be worse tomorrow.

## 2022-07-17 NOTE — ED Notes (Signed)
Pt states his left leg and foot is aching. Pt has visible hematoma below left knee.
